# Patient Record
Sex: Female | Born: 1970 | Race: White | Hispanic: No | Smoking: Current every day smoker
Health system: Southern US, Community
[De-identification: ages and names within clinical notes are randomized; demographics above are authoritative.]

---

## 2004-11-19 ENCOUNTER — Observation Stay: Payer: Self-pay | Admitting: Unknown Physician Specialty

## 2004-11-20 ENCOUNTER — Inpatient Hospital Stay: Payer: Self-pay

## 2005-02-10 ENCOUNTER — Ambulatory Visit: Payer: Self-pay | Admitting: Unknown Physician Specialty

## 2005-10-05 ENCOUNTER — Ambulatory Visit: Payer: Self-pay | Admitting: Specialist

## 2005-11-15 ENCOUNTER — Ambulatory Visit (HOSPITAL_COMMUNITY): Admission: RE | Admit: 2005-11-15 | Discharge: 2005-11-15 | Payer: Self-pay | Admitting: Neurosurgery

## 2008-01-03 IMAGING — CR DG LUMBAR SPINE 2-3V
1 series · 1 of 1 positions shown · non-contrast
Comparison: None.

CLINICAL DATA: L5-S1 microdiskectomy.
 LUMBAR SPINE ? 2 VIEW:

[view not recorded]
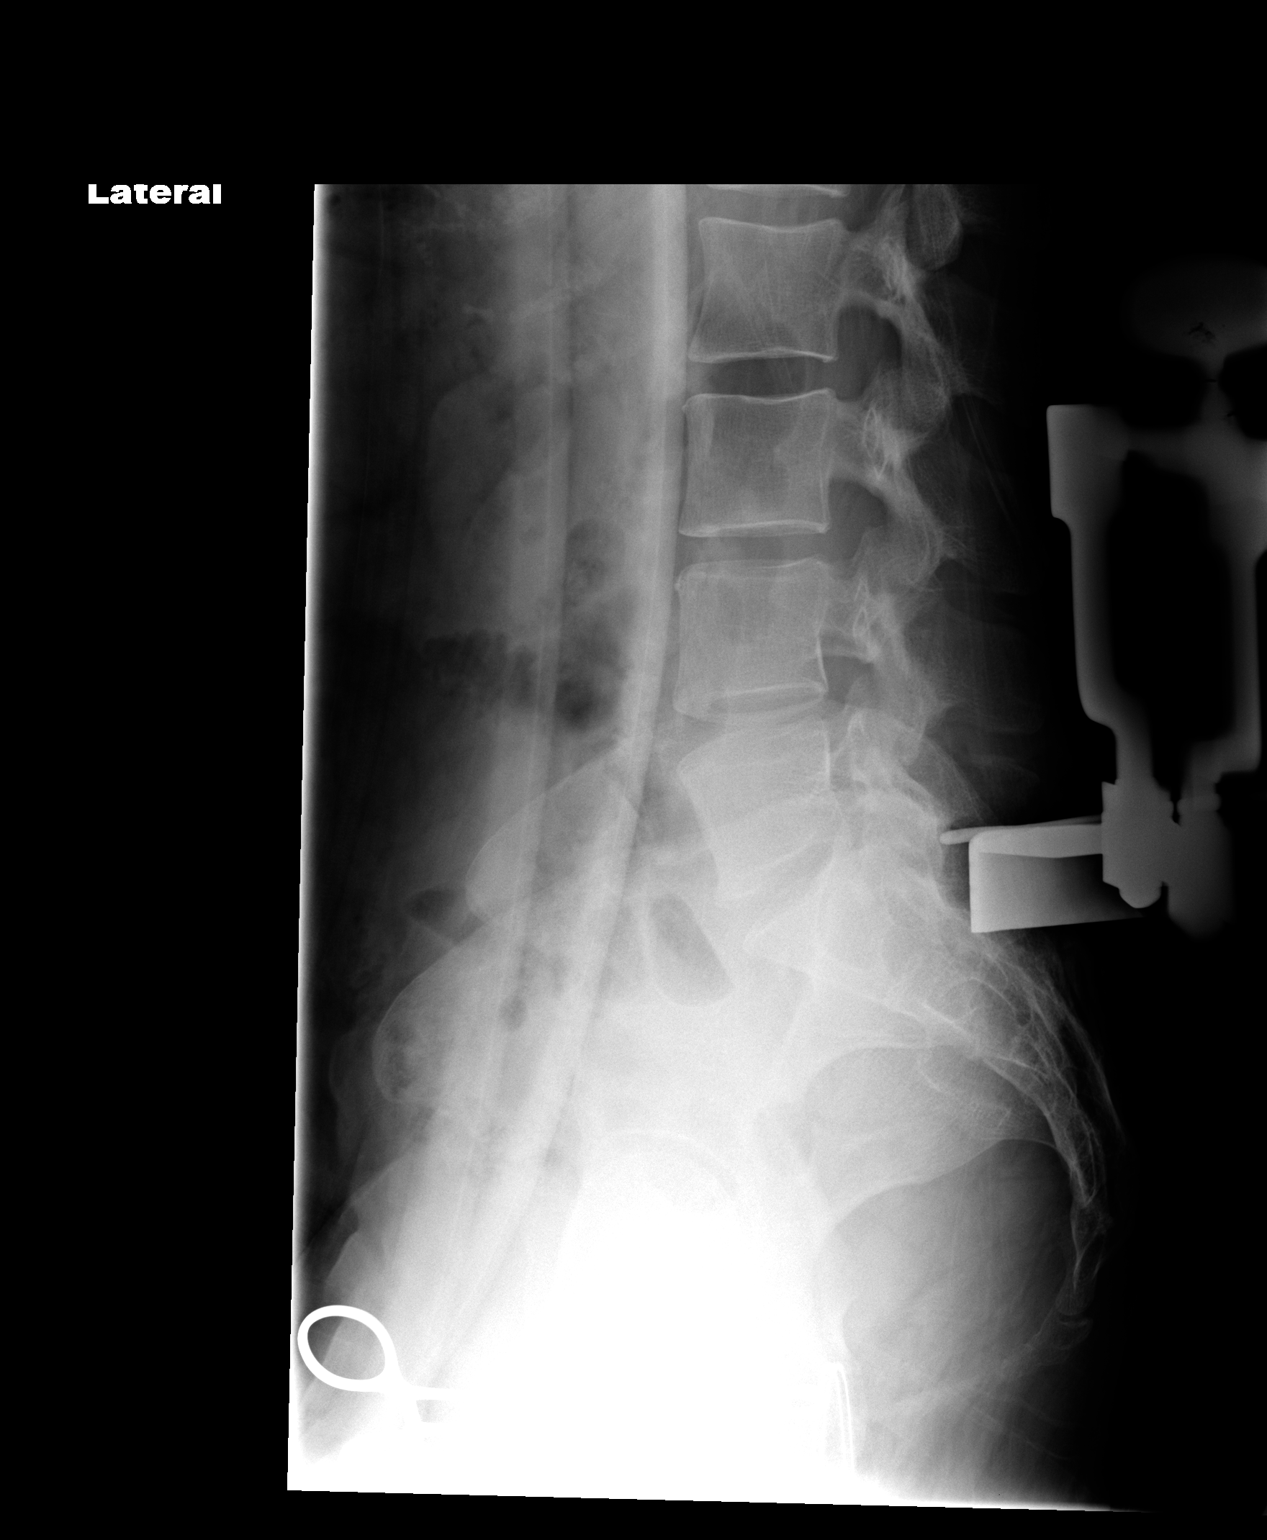

[1 of 1 positions shown; findings below may reference images not displayed]

FINDINGS: The first image, labeled 3565 hours, demonstrates surgical device projecting posterior to the L5 vertebral body.  
 The second image, labeled 4202 hours, demonstrates surgical device projecting posterior to the lumbosacral junction.
IMPRESSION: Localization of L5 and the lumbosacral junction intraoperatively as described.

## 2020-12-19 ENCOUNTER — Other Ambulatory Visit: Payer: Self-pay

## 2020-12-19 ENCOUNTER — Encounter: Payer: Self-pay | Admitting: Emergency Medicine

## 2020-12-19 ENCOUNTER — Ambulatory Visit
Admission: EM | Admit: 2020-12-19 | Discharge: 2020-12-19 | Disposition: A | Discharge status: Home / Self Care | Payer: Medicaid Other | Attending: Emergency Medicine | Admitting: Emergency Medicine

## 2020-12-19 DIAGNOSIS — J22 Unspecified acute lower respiratory infection: Secondary | ICD-10-CM | POA: Diagnosis not present

## 2020-12-19 MED ORDER — AMOXICILLIN-POT CLAVULANATE 875-125 MG PO TABS: 1.0000 | ORAL_TABLET | Freq: Two times a day (BID) | ORAL | 0 refills | Status: AC

## 2020-12-19 MED ORDER — PREDNISONE 20 MG PO TABS: 40.0000 mg | ORAL_TABLET | Freq: Every day | ORAL | 0 refills | Status: AC

## 2020-12-19 NOTE — ED Triage Notes (Signed)
Patient c/o cough, congestion and nasal congestion for 2 weeks.  Patient denies fevers. Patient states that she had a negative covid test at [facility] [address]

## 2020-12-19 NOTE — ED Provider Notes (Signed)
[facility]    CSN: [id number] Arrival date & time: 12/19/20  0801      History   Chief Complaint Chief Complaint  Patient presents with  . Cough  . Nasal Congestion    HPI Rachael Villarreal is a 50 y.o. female.   Rachael Villarreal presents with complaints of two weeks of cough and congestion. Sore throat originally but no longer. Also originally with headache which has since resolved. No shortness of breath  Or chest pain . Laying flat and smoking worsens cough. It is productive. Significant nasal drainage. No ear pain. No gi symptoms. Has been using mucinex as well as dayquil which helped temporarily. No history of asthma or COPD. Negative covid testing through [facility].    ROS per HPI, negative if not otherwise mentioned.      History reviewed. No pertinent past medical history.  There are no problems to display for this patient.   History reviewed. No pertinent surgical history.  OB History   No obstetric history on file.      Home Medications    Prior to Admission medications   Medication Sig Start Date End Date Taking? Authorizing Provider  amoxicillin-clavulanate (AUGMENTIN) 875-125 MG tablet Take 1 tablet by mouth every 12 (twelve) hours for 10 days. 12/19/20 12/29/20 Yes Levent Kornegay, Barron Alvine, NP  predniSONE (DELTASONE) 20 MG tablet Take 2 tablets (40 mg total) by mouth daily with breakfast for 5 days. 12/19/20 12/24/20 Yes Georgetta Haber, NP    Family History History reviewed. No pertinent family history.  Social History Social History   Tobacco Use  . Smoking status: Current Every Day Smoker    Types: Cigarettes  . Smokeless tobacco: Never Used  Vaping Use  . Vaping Use: Never used  Substance Use Topics  . Alcohol use: Yes  . Drug use: Never     Allergies   Sulfa antibiotics   Review of Systems Review of Systems   Physical Exam Triage Vital Signs ED Triage Vitals  Enc Vitals Group     BP 12/19/20 0819 (!) 176/106     Pulse  Rate 12/19/20 0819 88     Resp 12/19/20 0819 14     Temp 12/19/20 0819 98.4 F (36.9 C)     Temp Source 12/19/20 0819 Oral     SpO2 12/19/20 0819 97 %     Weight 12/19/20 0815 100 lb (45.4 kg)     Height 12/19/20 0815 5\' 2"  (1.575 m)     Head Circumference --      Peak Flow --      Pain Score 12/19/20 0815 0     Pain Loc --      Pain Edu? --      Excl. in GC? --    No data found.  Updated Vital Signs BP (!) 176/106 (BP Location: Left Arm)   Pulse 88   Temp 98.4 F (36.9 C) (Oral)   Resp 14   Ht 5\' 2"  (1.575 m)   Wt 100 lb (45.4 kg)   LMP 12/05/2020 (Approximate)   SpO2 97%   BMI 18.29 kg/m   Visual Acuity Right Eye Distance:   Left Eye Distance:   Bilateral Distance:    Right Eye Near:   Left Eye Near:    Bilateral Near:     Physical Exam Constitutional:      General: She is not in acute distress.    Appearance: She is well-developed.  Cardiovascular:  Rate and Rhythm: Normal rate.  Pulmonary:     Effort: Pulmonary effort is normal.     Comments: Coarse lung sounds with wheezing throughout; congested cough noted Skin:    General: Skin is warm and dry.  Neurological:     Mental Status: She is alert and oriented to person, place, and time.      UC Treatments / Results  Labs (all labs ordered are listed, but only abnormal results are displayed) Labs Reviewed - No data to display  EKG   Radiology No results found.  Procedures Procedures (including critical care time)  Medications Ordered in UC Medications - No data to display  Initial Impression / Assessment and Plan / UC Course  I have reviewed the triage vital signs and the nursing notes.  Pertinent labs & imaging results that were available during my care of the patient were reviewed by me and considered in my medical decision making (see chart for details).     Afebrile, no tachycardia or hypoxia. No work of breathing. Significant congested cough with wheezing, smoker. augmentin and  prednisone provided with return precautions discussed. Patient verbalized understanding and agreeable to plan.   Final Clinical Impressions(s) / UC Diagnoses   Final diagnoses:  Lower respiratory infection     Discharge Instructions     Push fluids to ensure adequate hydration and keep secretions thin.  Over the counter medications as needed for symptoms.  Decrease to quit smoking as able, as smoking can certainly exacerbate these symptoms.  Complete course of antibiotics as well as 5 days of prednisone.  If symptoms worsen or do not improve in the next week to return to be seen or to follow up with your PCP.     ED Prescriptions    Medication Sig Dispense Auth. Provider   amoxicillin-clavulanate (AUGMENTIN) 875-125 MG tablet Take 1 tablet by mouth every 12 (twelve) hours for 10 days. 20 tablet Linus Mako B, NP   predniSONE (DELTASONE) 20 MG tablet Take 2 tablets (40 mg total) by mouth daily with breakfast for 5 days. 10 tablet Georgetta Haber, NP     PDMP not reviewed this encounter.   Georgetta Haber, NP 12/19/20 [phone]

## 2020-12-19 NOTE — Discharge Instructions (Signed)
Push fluids to ensure adequate hydration and keep secretions thin.  Over the counter medications as needed for symptoms.  Decrease to quit smoking as able, as smoking can certainly exacerbate these symptoms.  Complete course of antibiotics as well as 5 days of prednisone.  If symptoms worsen or do not improve in the next week to return to be seen or to follow up with your PCP.

## 2023-04-13 ENCOUNTER — Ambulatory Visit
Admission: EM | Admit: 2023-04-13 | Discharge: 2023-04-13 | Disposition: A | Discharge status: Home / Self Care | Payer: Medicaid Other | Attending: Emergency Medicine | Admitting: Emergency Medicine

## 2023-04-13 DIAGNOSIS — R109 Unspecified abdominal pain: Secondary | ICD-10-CM | POA: Insufficient documentation

## 2023-04-13 DIAGNOSIS — R030 Elevated blood-pressure reading, without diagnosis of hypertension: Secondary | ICD-10-CM | POA: Diagnosis present

## 2023-04-13 DIAGNOSIS — N390 Urinary tract infection, site not specified: Secondary | ICD-10-CM | POA: Diagnosis present

## 2023-04-13 LAB — URINALYSIS, W/ REFLEX TO CULTURE (INFECTION SUSPECTED)
Bilirubin Urine: NEGATIVE
Glucose, UA: NEGATIVE mg/dL
Ketones, ur: NEGATIVE mg/dL
Nitrite: POSITIVE — AB
Protein, ur: >300 mg/dL — AB
Specific Gravity, Urine: 1.025 (ref 1.005–1.030)
WBC, UA: >50 WBC/hpf (ref 0–5)
pH: 6 (ref 5.0–8.0)

## 2023-04-13 MED ORDER — PHENAZOPYRIDINE HCL 95 MG PO TABS: 95.0000 mg | ORAL_TABLET | Freq: Three times a day (TID) | ORAL | 0 refills | Status: AC | PRN

## 2023-04-13 MED ORDER — KETOROLAC TROMETHAMINE 10 MG PO TABS: 10.0000 mg | ORAL_TABLET | Freq: Three times a day (TID) | ORAL | 0 refills | Status: AC | PRN

## 2023-04-13 MED ORDER — CEFUROXIME AXETIL 500 MG PO TABS: 500.0000 mg | ORAL_TABLET | Freq: Two times a day (BID) | ORAL | 0 refills | Status: AC

## 2023-04-13 NOTE — ED Provider Notes (Signed)
[facility]    CSN: [id number] Arrival date & time: 04/13/23  1414      History   Chief Complaint Chief Complaint  Patient presents with   Flank Pain   Urinary Urgency    HPI Rachael Villarreal is a 52 y.o. female.   52 year old female, Rachael Villarreal, presents to urgent care for urinary urgency x 3 days. Pt c/o low back pain,flank pain today. Treating pain with tylenol. Pt denies any nausea,vomiting, fever. No prior UTI hx.   The history is provided by the patient. No language interpreter was used.  Flank Pain Pertinent negatives include no abdominal pain.    History reviewed. No pertinent past medical history.  Patient Active Problem List   Diagnosis Date Noted   Acute UTI 04/13/2023   Flank pain 04/13/2023   Elevated blood pressure reading 04/13/2023    History reviewed. No pertinent surgical history.  OB History   No obstetric history on file.      Home Medications    Prior to Admission medications   Medication Sig Start Date End Date Taking? Authorizing Provider  cefUROXime (CEFTIN) 500 MG tablet Take 1 tablet (500 mg total) by mouth 2 (two) times daily with a meal for 7 days. 04/13/23 04/20/23 Yes Melonie Germani, Para March, NP  ketorolac (TORADOL) 10 MG tablet Take 1 tablet (10 mg total) by mouth every 8 (eight) hours as needed for up to 3 days for moderate pain or severe pain. 04/13/23 04/16/23 Yes Judah Chevere, Para March, NP  phenazopyridine (PYRIDIUM) 95 MG tablet Take 1 tablet (95 mg total) by mouth 3 (three) times daily as needed for up to 2 days for pain. Will turn urine orange 04/13/23 04/15/23 Yes Draven Natter, Para March, NP    Family History History reviewed. No pertinent family history.  Social History Social History   Tobacco Use   Smoking status: Every Day    Types: Cigarettes   Smokeless tobacco: Never  Vaping Use   Vaping status: Never Used  Substance Use Topics   Alcohol use: Yes   Drug use: Never     Allergies   Sulfa  antibiotics   Review of Systems Review of Systems  Constitutional:  Negative for fever.  Gastrointestinal:  Negative for abdominal pain, diarrhea, nausea and vomiting.  Genitourinary:  Positive for dysuria, flank pain and urgency.  Musculoskeletal:  Positive for back pain.  All other systems reviewed and are negative.    Physical Exam Triage Vital Signs ED Triage Vitals  Encounter Vitals Group     BP --      Systolic BP Percentile --      Diastolic BP Percentile --      Pulse --      Resp 04/13/23 1426 16     Temp --      Temp Source 04/13/23 1426 Oral     SpO2 --      Weight --      Height --      Head Circumference --      Peak Flow --      Pain Score 04/13/23 1425 8     Pain Loc --      Pain Education --      Exclude from Growth Chart --    No data found.  Updated Vital Signs BP (!) 171/79 (BP Location: Left Arm) Comment: smoked before triaging.  Pulse 92   Temp 98.3 F (36.8 C) (Oral)   Resp 16   SpO2 98%  Visual Acuity Right Eye Distance:   Left Eye Distance:   Bilateral Distance:    Right Eye Near:   Left Eye Near:    Bilateral Near:     Physical Exam Vitals and nursing note reviewed.  Constitutional:      General: She is not in acute distress.    Appearance: Normal appearance. She is well-developed and well-groomed.  HENT:     Head: Normocephalic.  Eyes:     Pupils: Pupils are equal, round, and reactive to light.  Cardiovascular:     Rate and Rhythm: Normal rate.  Pulmonary:     Effort: Pulmonary effort is normal.  Abdominal:     General: Bowel sounds are normal.     Tenderness: There is abdominal tenderness in the suprapubic area.  Musculoskeletal:        General: Normal range of motion.     Cervical back: Normal range of motion.     Lumbar back: Tenderness present.  Skin:    General: Skin is warm and dry.  Neurological:     General: No focal deficit present.     Mental Status: She is alert and oriented to person, place, and time.      GCS: GCS eye subscore is 4. GCS verbal subscore is 5. GCS motor subscore is 6.  Psychiatric:        Attention and Perception: Attention normal.        Mood and Affect: Mood normal.        Speech: Speech normal.        Behavior: Behavior normal. Behavior is cooperative.      UC Treatments / Results  Labs (all labs ordered are listed, but only abnormal results are displayed) Labs Reviewed  URINALYSIS, W/ REFLEX TO CULTURE (INFECTION SUSPECTED) - Abnormal; Notable for the following components:      Result Value   APPearance CLOUDY (*)    Hgb urine dipstick LARGE (*)    Protein, ur >300 (*)    Nitrite POSITIVE (*)    Leukocytes,Ua SMALL (*)    Bacteria, UA MANY (*)    All other components within normal limits  URINE CULTURE    EKG   Radiology No results found.  Procedures Procedures (including critical care time)  Medications Ordered in UC Medications - No data to display  Initial Impression / Assessment and Plan / UC Course  I have reviewed the triage vital signs and the nursing notes.  Pertinent labs & imaging results that were available during my care of the patient were reviewed by me and considered in my medical decision making (see chart for details).    Discussed exam findings and plan of care with patient, strict go to ER precautions given.   Patient verbalized understanding to this provider.   Ddx: Acute uti, kidney stone, elevated blood pressure Final Clinical Impressions(s) / UC Diagnoses   Final diagnoses:  Acute UTI  Flank pain  Elevated blood pressure reading     Discharge Instructions      Drink plenty of water, we are treating you for UTI, take meds as directed.  If you develop nausea, vomiting, worsening pain, fever, muscle aches, unable to urinate,etc  go to the ER for further evaluation of possible kidney stone.     ED Prescriptions     Medication Sig Dispense Auth. Provider   ketorolac (TORADOL) 10 MG tablet Take 1 tablet (10 mg  total) by mouth every 8 (eight) hours as needed for up to  3 days for moderate pain or severe pain. 9 tablet Levonia Wolfley, NP   cefUROXime (CEFTIN) 500 MG tablet Take 1 tablet (500 mg total) by mouth 2 (two) times daily with a meal for 7 days. 14 tablet Lillyann Ahart, NP   phenazopyridine (PYRIDIUM) 95 MG tablet Take 1 tablet (95 mg total) by mouth 3 (three) times daily as needed for up to 2 days for pain. Will turn urine orange 6 tablet Khalani Novoa, Para March, NP      PDMP not reviewed this encounter.   Clancy Gourd, NP 04/13/23 1515

## 2023-04-13 NOTE — ED Triage Notes (Signed)
Patient presents to [facility] for urinary urgency since Monday. Flank pain since today. Treating pain with tylenol.

## 2023-04-13 NOTE — Discharge Instructions (Addendum)
Drink plenty of water, we are treating you for UTI, take meds as directed.  If you develop nausea, vomiting, worsening pain, fever, muscle aches, unable to urinate,etc  go to the ER for further evaluation of possible kidney stone.

## 2023-04-16 LAB — URINE CULTURE: Culture: >=100000 — AB
